# Patient Record
Sex: Male | Born: 1955 | Race: White | Hispanic: No | Marital: Married
Health system: Southern US, Community
[De-identification: ages and names within clinical notes are randomized; demographics above are authoritative.]

---

## 2004-01-10 ENCOUNTER — Encounter: Admission: RE | Admit: 2004-01-10 | Discharge: 2004-01-10 | Payer: Self-pay | Admitting: Internal Medicine
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
## 2010-08-06 ENCOUNTER — Ambulatory Visit (HOSPITAL_BASED_OUTPATIENT_CLINIC_OR_DEPARTMENT_OTHER)
Admission: RE | Admit: 2010-08-06 | Discharge: 2010-08-06 | Disposition: A | Discharge status: Home / Self Care | Payer: BC Managed Care – PPO | Source: Ambulatory Visit | Attending: Orthopedic Surgery | Admitting: Orthopedic Surgery

## 2010-08-06 DIAGNOSIS — M67919 Unspecified disorder of synovium and tendon, unspecified shoulder: Secondary | ICD-10-CM | POA: Insufficient documentation

## 2010-08-06 DIAGNOSIS — Z01812 Encounter for preprocedural laboratory examination: Secondary | ICD-10-CM | POA: Insufficient documentation

## 2010-08-06 DIAGNOSIS — M24119 Other articular cartilage disorders, unspecified shoulder: Secondary | ICD-10-CM | POA: Insufficient documentation

## 2010-08-06 DIAGNOSIS — M719 Bursopathy, unspecified: Secondary | ICD-10-CM | POA: Insufficient documentation

## 2010-08-06 DIAGNOSIS — M25819 Other specified joint disorders, unspecified shoulder: Secondary | ICD-10-CM | POA: Insufficient documentation

## 2010-08-06 DIAGNOSIS — M19019 Primary osteoarthritis, unspecified shoulder: Secondary | ICD-10-CM | POA: Insufficient documentation

## 2010-08-06 LAB — POCT HEMOGLOBIN-HEMACUE: Hemoglobin: 15.1 g/dL (ref 13.0–17.0)

## 2010-08-07 NOTE — Op Note (Signed)
NAMEHAO, Daniel Wilkerson               ACCOUNT NO.:  [id number]  MEDICAL RECORD NO.:  [id number]           PATIENT TYPE:  LOCATION:                                 FACILITY:  PHYSICIAN:  Harvie Junior, M.D.        DATE OF BIRTH:  DATE OF PROCEDURE:  08/06/2010 DATE OF DISCHARGE:                              OPERATIVE REPORT   PREOPERATIVE DIAGNOSIS:  Impingement.  POSTOPERATIVE DIAGNOSES: 1. Small rotator cuff tear at the supraspinatus insertion. 2. Impingement. 3. Acromioclavicular joint arthritis. 4. Posterosuperior labral tear.  OPERATIVE PROCEDURES: 1. Arthroscopic rotator cuff repair with a FiberTape and a 5.5     SwiveLock anchor. 2. Acromioplasty from the lateral and posterior compartments. 3. Distal clavicle resection over 20 mm from the anterior compartment. 4. Debridement of posterosuperior labral tear within the glenohumeral     joint.  SURGEON:  Harvie Junior, MD  ASSISTANT:  Marshia Ly, PA  ANESTHESIA:  General.  BRIEF HISTORY:  Daniel Wilkerson is a 55 year old male with long history of having had significant complaints of pain in the right shoulder.  He had been treated conservatively for a period of time.  Because of continued complaints of pain and failure of all conservative cares, he was ultimately taken to the operating room for operative evaluation.  PROCEDURE:  The patient was brought to the operating room.  After adequate anesthesia was obtained with general anesthetic with interscalene block, the patient was placed supine on the operating table and moved into the beach-chair position.  All bony prominences were well padded.  Attention was then turned to the right shoulder.  After routine prep and drape, arthroscopic examination revealed that there was an obvious posterosuperior labral tear which was debrided back to a smooth and stable rim.  Biceps anchor was stable and well fixed.  The glenohumeral joint was pristine.  There was no ligamentous  injury in the front and back.  Attention was then turned towards the rotator cuff. Unfortunately, there was a full-thickness tear at the leading edge of the supraspinatus.  This was debrided from within the glenohumeral joint.  Once this was debrided, attention was turned out of the glenohumeral joint into the subacromial space and the ArthroCare wand was used to debride all of the synovium and the periosteum off the insertion of the acromion.  Once this was done, attention was turned to the small rotator cuff tear which was then debrided down to bleeding bone and the bone was scuffed up in this area.  The camera was then placed on the lateral portal and went posteriorly and freed up some of the intervening fibers from the delamination.  Once this was done, it allowed easy access to the rotator cuff and there was easy mobilization of the cuff tear.  Once this was completed, the attention was turned towards placement of a FiberTape suture.  This was passed through the lateral portal and we passed the anterior and posterior limbs.  These were shuttled out the front cannula and then at this point an 18 gauge needle was then used to isolate what would be the perfect  portal to allow the SwiveLock anchor.  A second portal was then made at this point.  The cannula replaced here.  She shuttled the shall sutures out the side and put them through the SwiveLock and then took an awl and made a small hole for the SwiveLock suture anchor.  Once this awl was used, the suture was then placed down the hole of the awl had made. When the sutures were in the bottom and the attention was appropriately assessed and the SwiveLock anchor was locked and once this was assessed, the sutures were cut.  Excellent repair of the rotator cuff had been achieved.  At this point, attention was then turned towards the acromion where an acromioplasty was performed from lateral and posterior compartment smoothing down the  acromion.  Attention was then turned to the distal clavicle.  Because the rotator cuff had been torn, we were concerned about that being impinging the area and distal clavicle resection was undertaken over 20 mm.  Once this was done, the ArthroCare wand was used to fully ablate the end of the distal clavicle to prevent overgrowth and regrowth and the shaver was used to thoroughly debride the glenohumeral joint of all bone dust and remaining bone fragments. At this point, the shoulder was copiously and thoroughly lavaged and suctioned dry.  The arthroscopic portals were closed with bandage. Stitch was used in the lateral most portals to help pull them together. The patient was taken to the recovery room where he was noted to be in satisfactory condition.  Estimated blood pressure for the procedure was none.     Harvie Junior, M.D.     Ranae Plumber  D:  08/06/2010  T:  08/07/2010  Job:  [id number]  Electronically Signed by Jodi Geralds M.D. on 08/07/2010 05:05:50 PM

## 2015-09-27 ENCOUNTER — Ambulatory Visit (INDEPENDENT_AMBULATORY_CARE_PROVIDER_SITE_OTHER): Payer: Self-pay | Admitting: Internal Medicine

## 2015-09-27 DIAGNOSIS — Z23 Encounter for immunization: Secondary | ICD-10-CM

## 2015-09-27 DIAGNOSIS — Z7189 Other specified counseling: Secondary | ICD-10-CM

## 2015-09-27 DIAGNOSIS — Z9189 Other specified personal risk factors, not elsewhere classified: Secondary | ICD-10-CM

## 2015-09-27 DIAGNOSIS — Z789 Other specified health status: Secondary | ICD-10-CM

## 2015-09-27 DIAGNOSIS — Z7184 Encounter for health counseling related to travel: Secondary | ICD-10-CM

## 2015-09-27 NOTE — Patient Instructions (Signed)
[facility]                [address]                   [place], [place] [id number]                      Phone: [phone]                        Fax: [phone]   Planned departure date: June 24        Planned return date: 1 week Countries of travel: [place]   Guidelines for the Prevention & Treatment of Traveler's Diarrhea  Prevention: "Boil it, Peel it, Adriana SimasCook it, or Forget it"   the fewer chances -> lower risk: try to stick to food & water precautions as much as possible"   If it's "piping hot"; it is probably okay, if not, it may not be   Treatment   1) You should always take care to drink lots of fluids in order to avoid dehydration   2) You should bring medications with you in case you come down with a case of diarrhea   3) OTC = bring pepto-bismol - can take with initial abdominal symptoms;                    Imodium - can help slow down your intestinal tract, can help relief cramps                    and diarrhea, can take if no bloody diarrhea  Use ciprofloxacin if needed for traveler's diarrhea  Guidelines for the Prevention of Malaria  Avoidance:  -fewer mosquito bites = lower risk. Mosquitos can bite at night as well as daytime  -cover up (long sleeve clothing), mosquito nets, screens  -Insect repellent for your skin ( DEET containing lotion > 20%): for clothes ( permethrin spray)   On about June 10th, start chloroquine for malaria prevention, take weekly through 4 weeks after your return.   Immunizations received today: Hepatitis A series and Typhoid (parenteral)  Future immunizations, if indicated Hepatitis A series, around end of November 2017   Prior to travel:  1) Be sure to pick up appropriate prescriptions, including medicine you take daily. Do not expect to be able to fill your prescriptions abroad.  2) Strongly consider obtaining traveler's insurance, including emergency evacuation  insurance. Most plans in the [place] do not cover participants abroad. (see below for resources)  3) Register at the appropriate [place] embassy or consulate with travel dates so they are aware of your presence in-country and for helpful advice during travel using the [employer] Traveler Enrollment Program (STEP, GuyGalaxy.sihttps://step.state.gov/step).  4) Leave contact information with a relative or friend.  5) Keep a Corporate treasurerphotocopy passport, credit cards in case they become lost or stolen  6) Inform your credit card company that you will be travelling abroad   During travel:  1) If you become ill and need medical advice, the [place] [employer] website of the country you are traveling in general provides a list of English speaking doctors.  We are also available on MyChart for remote consultation if you register prior to travel. 2) Avoid motorcycles or scooters when at all possible. Traffic laws in many countries are lax and accidents occur frequently.  3) Do not take  any unnecessary risks that you wouldn't do at home.   Resources:  -Country specific information: BlindResource.ca or GreenNylon.com.cy  -Press photographer (DEET, mosquito nets): [employer], [employer] store, [employer], [place] insurance options: gatewayplans.com; http://clayton-rivera.info/; travelguard.com or Good Pil[employer] Pride, gninsurance.com or info@gninsurance .com, 902-294-2597.   Post Travel:  If you return from your trip ill, call your primary care doctor or our travel clinic @ [phone].   Enjoy your trip and know that with proper pre-travel preparation, most people have an enjoyable and uninterrupted trip!

## 2015-10-01 DIAGNOSIS — Z7184 Encounter for health counseling related to travel: Secondary | ICD-10-CM | POA: Insufficient documentation

## 2015-10-01 NOTE — Progress Notes (Signed)
Subjective:   Areatha KeasKelly D Czerwonka is a 60 y.o. male who presents to the Infectious Disease clinic for travel consultation. Planned departure date: June 24          Planned return date: 7 days Countries of travel: [place] Areas in country: rural   Accommodations: hotel Purpose of travel: missionary work Prior travel out of [place]: yes     Objective:   Medications: none    Assessment:    No contraindications to travel. none     Plan:    Issues discussed: freshwater swimming, insect-borne illnesses, malaria, MVA safety, rabies, safe food/water, traveler's diarrhea, website/handouts for more information, what to do if ill upon return and what to do if ill while there. Immunizations recommended: Hepatitis A series and Typhoid (parenteral). Malaria prophylaxis: not indicated Traveler's diarrhea prophylaxis: ciprofloxacin. Total duration of visit: 1 Hour. Total time spent on education, counseling, coordination of care: 30 Minutes.

## 2015-10-11 NOTE — Addendum Note (Signed)
Addended by: Andree CossHOWELL, [place] Nie M on: 10/11/2015 02:52 PM   Modules accepted: Orders

## 2016-02-25 DIAGNOSIS — Z125 Encounter for screening for malignant neoplasm of prostate: Secondary | ICD-10-CM | POA: Diagnosis not present

## 2016-02-25 DIAGNOSIS — R7302 Impaired glucose tolerance (oral): Secondary | ICD-10-CM | POA: Diagnosis not present

## 2016-02-25 DIAGNOSIS — Z0000 Encounter for general adult medical examination without abnormal findings: Secondary | ICD-10-CM | POA: Diagnosis not present

## 2016-03-03 DIAGNOSIS — N528 Other male erectile dysfunction: Secondary | ICD-10-CM | POA: Diagnosis not present

## 2016-03-03 DIAGNOSIS — M47819 Spondylosis without myelopathy or radiculopathy, site unspecified: Secondary | ICD-10-CM | POA: Diagnosis not present

## 2016-03-03 DIAGNOSIS — R030 Elevated blood-pressure reading, without diagnosis of hypertension: Secondary | ICD-10-CM | POA: Diagnosis not present

## 2016-03-03 DIAGNOSIS — Z23 Encounter for immunization: Secondary | ICD-10-CM | POA: Diagnosis not present

## 2016-03-03 DIAGNOSIS — Z1389 Encounter for screening for other disorder: Secondary | ICD-10-CM | POA: Diagnosis not present

## 2016-03-03 DIAGNOSIS — Z125 Encounter for screening for malignant neoplasm of prostate: Secondary | ICD-10-CM | POA: Diagnosis not present

## 2016-03-03 DIAGNOSIS — Z0000 Encounter for general adult medical examination without abnormal findings: Secondary | ICD-10-CM | POA: Diagnosis not present

## 2016-03-03 DIAGNOSIS — E7800 Pure hypercholesterolemia, unspecified: Secondary | ICD-10-CM | POA: Diagnosis not present

## 2016-03-04 DIAGNOSIS — Z1212 Encounter for screening for malignant neoplasm of rectum: Secondary | ICD-10-CM | POA: Diagnosis not present

## 2016-03-18 DIAGNOSIS — L821 Other seborrheic keratosis: Secondary | ICD-10-CM | POA: Diagnosis not present

## 2016-03-18 DIAGNOSIS — D225 Melanocytic nevi of trunk: Secondary | ICD-10-CM | POA: Diagnosis not present

## 2017-03-02 DIAGNOSIS — Z125 Encounter for screening for malignant neoplasm of prostate: Secondary | ICD-10-CM | POA: Diagnosis not present

## 2017-03-02 DIAGNOSIS — Z0000 Encounter for general adult medical examination without abnormal findings: Secondary | ICD-10-CM | POA: Diagnosis not present

## 2017-03-02 DIAGNOSIS — R7302 Impaired glucose tolerance (oral): Secondary | ICD-10-CM | POA: Diagnosis not present

## 2017-03-09 DIAGNOSIS — E7800 Pure hypercholesterolemia, unspecified: Secondary | ICD-10-CM | POA: Diagnosis not present

## 2017-03-09 DIAGNOSIS — Z0000 Encounter for general adult medical examination without abnormal findings: Secondary | ICD-10-CM | POA: Diagnosis not present

## 2017-03-09 DIAGNOSIS — M47819 Spondylosis without myelopathy or radiculopathy, site unspecified: Secondary | ICD-10-CM | POA: Diagnosis not present

## 2017-03-09 DIAGNOSIS — R7302 Impaired glucose tolerance (oral): Secondary | ICD-10-CM | POA: Diagnosis not present

## 2017-03-09 DIAGNOSIS — Z23 Encounter for immunization: Secondary | ICD-10-CM | POA: Diagnosis not present

## 2017-03-09 DIAGNOSIS — N528 Other male erectile dysfunction: Secondary | ICD-10-CM | POA: Diagnosis not present

## 2017-03-09 DIAGNOSIS — Z1389 Encounter for screening for other disorder: Secondary | ICD-10-CM | POA: Diagnosis not present

## 2017-03-11 DIAGNOSIS — Z1212 Encounter for screening for malignant neoplasm of rectum: Secondary | ICD-10-CM | POA: Diagnosis not present

## 2018-03-04 DIAGNOSIS — R7302 Impaired glucose tolerance (oral): Secondary | ICD-10-CM | POA: Diagnosis not present

## 2018-03-04 DIAGNOSIS — Z0000 Encounter for general adult medical examination without abnormal findings: Secondary | ICD-10-CM | POA: Diagnosis not present

## 2018-03-04 DIAGNOSIS — Z125 Encounter for screening for malignant neoplasm of prostate: Secondary | ICD-10-CM | POA: Diagnosis not present

## 2018-03-11 DIAGNOSIS — R7302 Impaired glucose tolerance (oral): Secondary | ICD-10-CM | POA: Diagnosis not present

## 2018-03-11 DIAGNOSIS — Z1389 Encounter for screening for other disorder: Secondary | ICD-10-CM | POA: Diagnosis not present

## 2018-03-11 DIAGNOSIS — Z125 Encounter for screening for malignant neoplasm of prostate: Secondary | ICD-10-CM | POA: Diagnosis not present

## 2018-03-11 DIAGNOSIS — E7800 Pure hypercholesterolemia, unspecified: Secondary | ICD-10-CM | POA: Diagnosis not present

## 2018-03-11 DIAGNOSIS — Z23 Encounter for immunization: Secondary | ICD-10-CM | POA: Diagnosis not present

## 2018-03-11 DIAGNOSIS — Z1331 Encounter for screening for depression: Secondary | ICD-10-CM | POA: Diagnosis not present

## 2018-03-11 DIAGNOSIS — R030 Elevated blood-pressure reading, without diagnosis of hypertension: Secondary | ICD-10-CM | POA: Diagnosis not present

## 2018-03-11 DIAGNOSIS — M47819 Spondylosis without myelopathy or radiculopathy, site unspecified: Secondary | ICD-10-CM | POA: Diagnosis not present

## 2018-03-11 DIAGNOSIS — Z0000 Encounter for general adult medical examination without abnormal findings: Secondary | ICD-10-CM | POA: Diagnosis not present

## 2018-03-18 DIAGNOSIS — Z1212 Encounter for screening for malignant neoplasm of rectum: Secondary | ICD-10-CM | POA: Diagnosis not present

## 2018-06-09 DIAGNOSIS — M25511 Pain in right shoulder: Secondary | ICD-10-CM | POA: Diagnosis not present

## 2018-06-09 DIAGNOSIS — S0993XA Unspecified injury of face, initial encounter: Secondary | ICD-10-CM | POA: Diagnosis not present

## 2018-06-09 DIAGNOSIS — S0990XA Unspecified injury of head, initial encounter: Secondary | ICD-10-CM | POA: Diagnosis not present

## 2018-06-09 DIAGNOSIS — S4991XA Unspecified injury of right shoulder and upper arm, initial encounter: Secondary | ICD-10-CM | POA: Diagnosis not present

## 2018-06-09 DIAGNOSIS — R6884 Jaw pain: Secondary | ICD-10-CM | POA: Diagnosis not present

## 2018-06-09 DIAGNOSIS — M19011 Primary osteoarthritis, right shoulder: Secondary | ICD-10-CM | POA: Diagnosis not present

## 2018-06-09 DIAGNOSIS — S4351XA Sprain of right acromioclavicular joint, initial encounter: Secondary | ICD-10-CM | POA: Diagnosis not present

## 2018-06-23 DIAGNOSIS — W000XXA Fall on same level due to ice and snow, initial encounter: Secondary | ICD-10-CM | POA: Diagnosis not present

## 2018-06-23 DIAGNOSIS — M25511 Pain in right shoulder: Secondary | ICD-10-CM | POA: Diagnosis not present

## 2018-07-06 DIAGNOSIS — M25511 Pain in right shoulder: Secondary | ICD-10-CM | POA: Diagnosis not present

## 2018-07-06 DIAGNOSIS — W000XXD Fall on same level due to ice and snow, subsequent encounter: Secondary | ICD-10-CM | POA: Diagnosis not present

## 2018-07-08 DIAGNOSIS — M25511 Pain in right shoulder: Secondary | ICD-10-CM | POA: Diagnosis not present

## 2018-07-08 DIAGNOSIS — W000XXD Fall on same level due to ice and snow, subsequent encounter: Secondary | ICD-10-CM | POA: Diagnosis not present

## 2018-07-13 DIAGNOSIS — M25511 Pain in right shoulder: Secondary | ICD-10-CM | POA: Diagnosis not present

## 2018-07-13 DIAGNOSIS — W000XXD Fall on same level due to ice and snow, subsequent encounter: Secondary | ICD-10-CM | POA: Diagnosis not present

## 2018-07-15 DIAGNOSIS — M25511 Pain in right shoulder: Secondary | ICD-10-CM | POA: Diagnosis not present

## 2018-07-15 DIAGNOSIS — W000XXD Fall on same level due to ice and snow, subsequent encounter: Secondary | ICD-10-CM | POA: Diagnosis not present

## 2018-07-20 DIAGNOSIS — M25511 Pain in right shoulder: Secondary | ICD-10-CM | POA: Diagnosis not present

## 2018-07-20 DIAGNOSIS — W000XXD Fall on same level due to ice and snow, subsequent encounter: Secondary | ICD-10-CM | POA: Diagnosis not present

## 2018-07-27 DIAGNOSIS — W000XXD Fall on same level due to ice and snow, subsequent encounter: Secondary | ICD-10-CM | POA: Diagnosis not present

## 2018-07-27 DIAGNOSIS — M25511 Pain in right shoulder: Secondary | ICD-10-CM | POA: Diagnosis not present

## 2018-08-03 DIAGNOSIS — M25511 Pain in right shoulder: Secondary | ICD-10-CM | POA: Diagnosis not present

## 2018-08-03 DIAGNOSIS — W000XXD Fall on same level due to ice and snow, subsequent encounter: Secondary | ICD-10-CM | POA: Diagnosis not present

## 2018-08-04 DIAGNOSIS — M25511 Pain in right shoulder: Secondary | ICD-10-CM | POA: Diagnosis not present

## 2018-10-05 DIAGNOSIS — M75121 Complete rotator cuff tear or rupture of right shoulder, not specified as traumatic: Secondary | ICD-10-CM | POA: Diagnosis not present

## 2018-10-05 DIAGNOSIS — M7581 Other shoulder lesions, right shoulder: Secondary | ICD-10-CM | POA: Diagnosis not present

## 2018-10-05 DIAGNOSIS — M7541 Impingement syndrome of right shoulder: Secondary | ICD-10-CM | POA: Diagnosis not present

## 2018-10-05 DIAGNOSIS — M24211 Disorder of ligament, right shoulder: Secondary | ICD-10-CM | POA: Diagnosis not present

## 2018-10-05 DIAGNOSIS — M6788 Other specified disorders of synovium and tendon, other site: Secondary | ICD-10-CM | POA: Diagnosis not present

## 2018-10-05 DIAGNOSIS — M19011 Primary osteoarthritis, right shoulder: Secondary | ICD-10-CM | POA: Diagnosis not present

## 2018-10-05 DIAGNOSIS — M25511 Pain in right shoulder: Secondary | ICD-10-CM | POA: Diagnosis not present

## 2018-10-06 DIAGNOSIS — S46011D Strain of muscle(s) and tendon(s) of the rotator cuff of right shoulder, subsequent encounter: Secondary | ICD-10-CM | POA: Diagnosis not present

## 2018-10-17 DIAGNOSIS — M25541 Pain in joints of right hand: Secondary | ICD-10-CM | POA: Diagnosis not present
